# Patient Record
Sex: Female | Born: 1975 | Race: Black or African American | Hispanic: No | Marital: Single | Smoking: Current some day smoker
Health system: Southern US, Community
[De-identification: ages and names within clinical notes are randomized; demographics above are authoritative.]

## PROBLEM LIST (undated history)

## (undated) DIAGNOSIS — E119 Type 2 diabetes mellitus without complications: Secondary | ICD-10-CM

## (undated) DIAGNOSIS — E785 Hyperlipidemia, unspecified: Secondary | ICD-10-CM

## (undated) DIAGNOSIS — I10 Essential (primary) hypertension: Secondary | ICD-10-CM

## (undated) HISTORY — PX: HAND SURGERY: SHX662

## (undated) HISTORY — PX: BACK SURGERY: SHX140

## (undated) HISTORY — PX: SHOULDER SURGERY: SHX246

---

## 2009-08-26 ENCOUNTER — Encounter (INDEPENDENT_AMBULATORY_CARE_PROVIDER_SITE_OTHER): Payer: Self-pay | Admitting: Obstetrics

## 2009-08-26 ENCOUNTER — Inpatient Hospital Stay (HOSPITAL_COMMUNITY): Admission: AD | Admit: 2009-08-26 | Discharge: 2009-08-29 | Payer: Self-pay | Admitting: Obstetrics

## 2009-08-30 ENCOUNTER — Encounter: Admission: RE | Admit: 2009-08-30 | Discharge: 2009-09-29 | Payer: Self-pay | Admitting: Obstetrics

## 2009-10-28 ENCOUNTER — Encounter: Admission: RE | Admit: 2009-10-28 | Discharge: 2009-11-03 | Payer: Self-pay | Admitting: Obstetrics

## 2010-09-28 LAB — COMPREHENSIVE METABOLIC PANEL
ALT: 21 U/L (ref 0–35)
ALT: 26 U/L (ref 0–35)
AST: 31 U/L (ref 0–37)
Albumin: 2.1 g/dL — ABNORMAL LOW (ref 3.5–5.2)
Albumin: 2.2 g/dL — ABNORMAL LOW (ref 3.5–5.2)
Albumin: 2.8 g/dL — ABNORMAL LOW (ref 3.5–5.2)
Alkaline Phosphatase: 165 U/L — ABNORMAL HIGH (ref 39–117)
Alkaline Phosphatase: 184 U/L — ABNORMAL HIGH (ref 39–117)
Alkaline Phosphatase: 259 U/L — ABNORMAL HIGH (ref 39–117)
BUN: 4 mg/dL — ABNORMAL LOW (ref 6–23)
BUN: 5 mg/dL — ABNORMAL LOW (ref 6–23)
Calcium: 7.1 mg/dL — ABNORMAL LOW (ref 8.4–10.5)
Chloride: 101 mEq/L (ref 96–112)
Chloride: 106 mEq/L (ref 96–112)
GFR calc Af Amer: >60 mL/min (ref >60)
Glucose, Bld: 89 mg/dL (ref 70–99)
Potassium: 4 mEq/L (ref 3.5–5.1)
Potassium: 4.5 mEq/L (ref 3.5–5.1)
Potassium: 4.5 mEq/L (ref 3.5–5.1)
Total Bilirubin: 0.4 mg/dL (ref 0.3–1.2)
Total Bilirubin: 0.5 mg/dL (ref 0.3–1.2)
Total Protein: 5.1 g/dL — ABNORMAL LOW (ref 6.0–8.3)

## 2010-09-28 LAB — CBC
HCT: 33.3 % — ABNORMAL LOW (ref 36.0–46.0)
HCT: 34.1 % — ABNORMAL LOW (ref 36.0–46.0)
Hemoglobin: 11.2 g/dL — ABNORMAL LOW (ref 12.0–15.0)
MCHC: 33.5 g/dL (ref 30.0–36.0)
MCV: 89 fL (ref 78.0–100.0)
Platelets: 222 10*3/uL (ref 150–400)
RBC: 4.53 MIL/uL (ref 3.87–5.11)
RDW: 13.4 % (ref 11.5–15.5)
WBC: 11.9 10*3/uL — ABNORMAL HIGH (ref 4.0–10.5)
WBC: 8.3 10*3/uL (ref 4.0–10.5)

## 2010-09-28 LAB — URIC ACID
Uric Acid, Serum: 6.4 mg/dL (ref 2.4–7.0)
Uric Acid, Serum: 6.6 mg/dL (ref 2.4–7.0)

## 2010-09-28 LAB — MRSA PCR SCREENING: MRSA by PCR: NEGATIVE

## 2010-09-28 LAB — MAGNESIUM: Magnesium: 5.7 mg/dL — ABNORMAL HIGH (ref 1.5–2.5)

## 2010-09-28 LAB — RPR: RPR Ser Ql: NONREACTIVE

## 2011-01-12 ENCOUNTER — Encounter: Payer: Self-pay | Admitting: Obstetrics and Gynecology

## 2011-05-14 ENCOUNTER — Encounter: Payer: Self-pay | Admitting: *Deleted

## 2011-05-14 ENCOUNTER — Emergency Department (INDEPENDENT_AMBULATORY_CARE_PROVIDER_SITE_OTHER)
Admission: EM | Admit: 2011-05-14 | Discharge: 2011-05-14 | Disposition: A | Discharge status: Home / Self Care | Payer: Self-pay | Source: Home / Self Care | Attending: Family Medicine | Admitting: Family Medicine

## 2011-05-14 ENCOUNTER — Emergency Department (INDEPENDENT_AMBULATORY_CARE_PROVIDER_SITE_OTHER): Payer: Self-pay

## 2011-05-14 DIAGNOSIS — R109 Unspecified abdominal pain: Secondary | ICD-10-CM

## 2011-05-14 LAB — WET PREP, GENITAL
Trich, Wet Prep: NONE SEEN
Yeast Wet Prep HPF POC: NONE SEEN

## 2011-05-14 LAB — POCT PREGNANCY, URINE: Preg Test, Ur: NEGATIVE

## 2011-05-14 LAB — POCT URINALYSIS DIP (DEVICE)
Bilirubin Urine: NEGATIVE
Glucose, UA: NEGATIVE mg/dL
Ketones, ur: NEGATIVE mg/dL
Leukocytes, UA: NEGATIVE
Specific Gravity, Urine: 1.02 (ref 1.005–1.030)

## 2011-05-14 MED ORDER — METRONIDAZOLE 500 MG PO TABS: 500.0000 mg | ORAL_TABLET | Freq: Two times a day (BID) | ORAL | Status: AC

## 2011-05-14 MED ORDER — CIPROFLOXACIN HCL 500 MG PO TABS: 500.0000 mg | ORAL_TABLET | Freq: Two times a day (BID) | ORAL | Status: AC

## 2011-05-14 NOTE — ED Notes (Signed)
Pt with onset of abdominal pain 4pm yesterday left lower abd radiating across lower abd - rectal pressure

## 2011-05-14 NOTE — ED Provider Notes (Signed)
History     CSN: [id number] Arrival date & time: 05/14/2011 12:51 PM   First MD Initiated Contact with Patient 05/14/11 1236      Chief Complaint  Patient presents with  . Abdominal Pain  . Constipation  . Rectal Pain    (Consider location/radiation/quality/duration/timing/severity/associated sxs/prior treatment) HPI Comments: Pt describes the pain yesterday as constant, but with waves of sharp pain; dull constant pain today, no sharp episodes  Patient is a 35 y.o. female presenting with abdominal pain. The history is provided by the patient.  Abdominal Pain The primary symptoms of the illness include nausea. The primary symptoms of the illness do not include fever, fatigue, vomiting, diarrhea, dysuria, vaginal discharge or vaginal bleeding. Episode onset: yesterday afternoon. The onset of the illness was gradual. The problem has been gradually worsening.  The patient states that she believes she is currently not pregnant. Additional symptoms associated with the illness include back pain. Symptoms associated with the illness do not include chills, constipation, urgency, hematuria or frequency. Significant associated medical issues do not include diverticulitis. Associated medical issues comments: hx herpes otherwise no hx STDs.    History reviewed. No pertinent past medical history.  Past Surgical History  Procedure Date  . Cesarean section     History reviewed. No pertinent family history.  History  Substance Use Topics  . Smoking status: Current Some Day Smoker  . Smokeless tobacco: Not on file  . Alcohol Use: No    OB History    Grav Para Term Preterm Abortions TAB SAB Ect Mult Living                  Review of Systems  Constitutional: Negative for fever, chills and fatigue.  Respiratory: Negative.   Cardiovascular: Negative.   Gastrointestinal: Positive for nausea and abdominal distention. Negative for vomiting, diarrhea, constipation and blood in stool.   Pt denies diarrhea but states bowel movement this morning was loose  Genitourinary: Positive for flank pain. Negative for dysuria, urgency, frequency, hematuria, vaginal bleeding, vaginal discharge, vaginal pain, menstrual problem and pelvic pain.  Musculoskeletal: Positive for back pain.    Allergies  Nickel  Home Medications  No current outpatient prescriptions on file.  BP 134/78  Pulse 86  Temp(Src) 98.6 F (37 C) (Oral)  Resp 17  SpO2 98%  Physical Exam  Constitutional: Vital signs are normal. She appears well-developed and well-nourished.  Non-toxic appearance. She does not have a sickly appearance.  Cardiovascular: Normal rate and regular rhythm.   Pulmonary/Chest: Effort normal and breath sounds normal.  Abdominal: Soft. Normal appearance and bowel sounds are normal. There is tenderness in the left lower quadrant. There is no rebound, no guarding and no CVA tenderness.         LLQ pain with palp, otherwise nontender to palp; obese  Genitourinary: Uterus normal. Cervix exhibits motion tenderness. Cervix exhibits no discharge. Right adnexum displays no tenderness. Left adnexum displays no tenderness. There is bleeding around the vagina. No tenderness around the vagina. Vaginal discharge found.       Obesity limits bimanual exam; mirena strings coming from cervix    ED Course  Procedures (including critical care time)   Labs Reviewed  POCT PREGNANCY, URINE  POCT URINALYSIS DIPSTICK  POCT PREGNANCY, URINE  GC/CHLAMYDIA PROBE AMP, GENITAL  WET PREP, GENITAL   No results found.   No diagnosis found.    MDM  UA normal except trace heme; pregnancy negative Discussed with Dr. Lorenza Chick. Findings not clearly pointing  to any one diagnosis.  Possibilities include PID or diverticulitis.  Will rx cipro and flagyl for now, if any concerning findings on unresulted labs, may also need to tx for vag infection.   Pt stable at this time.        Cathlyn Parsons, NP 05/14/11  [phone]

## 2011-05-15 LAB — GC/CHLAMYDIA PROBE AMP, GENITAL: GC Probe Amp, Genital: NEGATIVE

## 2011-05-17 NOTE — ED Provider Notes (Signed)
Medical screening examination/treatment/procedure(s) were performed by non-physician practitioner and as supervising physician I was immediately available for consultation/collaboration.  Login Muckleroy G  D.O.    Nekisha Mcdiarmid G Florella Mcneese, MD 05/17/11 1456 

## 2011-05-18 NOTE — ED Notes (Signed)
Chart and labs reviewed. Pt. Adequately treated with Metronidazole.

## 2012-09-30 IMAGING — CR DG ABDOMEN ACUTE W/ 1V CHEST
3 series · 3 of 3 positions shown · non-contrast
Comparison: None.

CLINICAL DATA: Abdominal pain

ACUTE ABDOMEN SERIES (ABDOMEN 2 VIEW & CHEST 1 VIEW)

[view not recorded (1 of 3)]
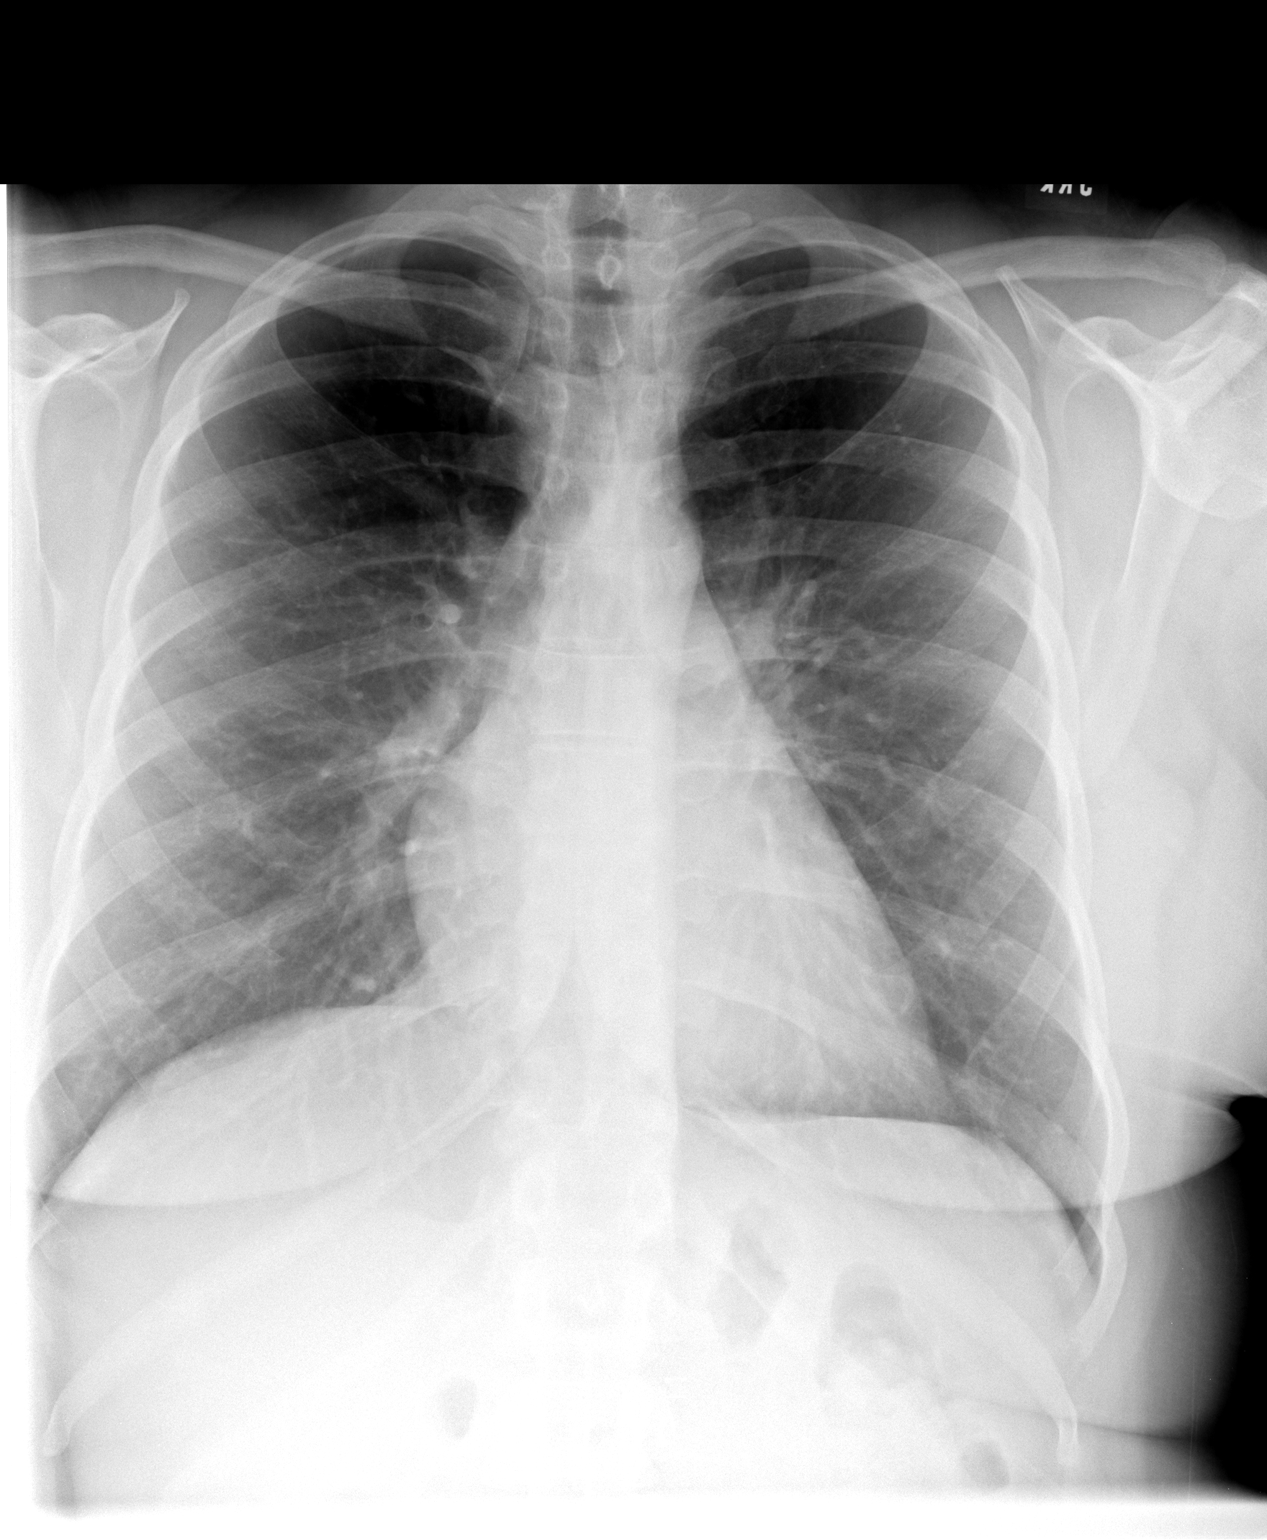

[view not recorded (2 of 3)]
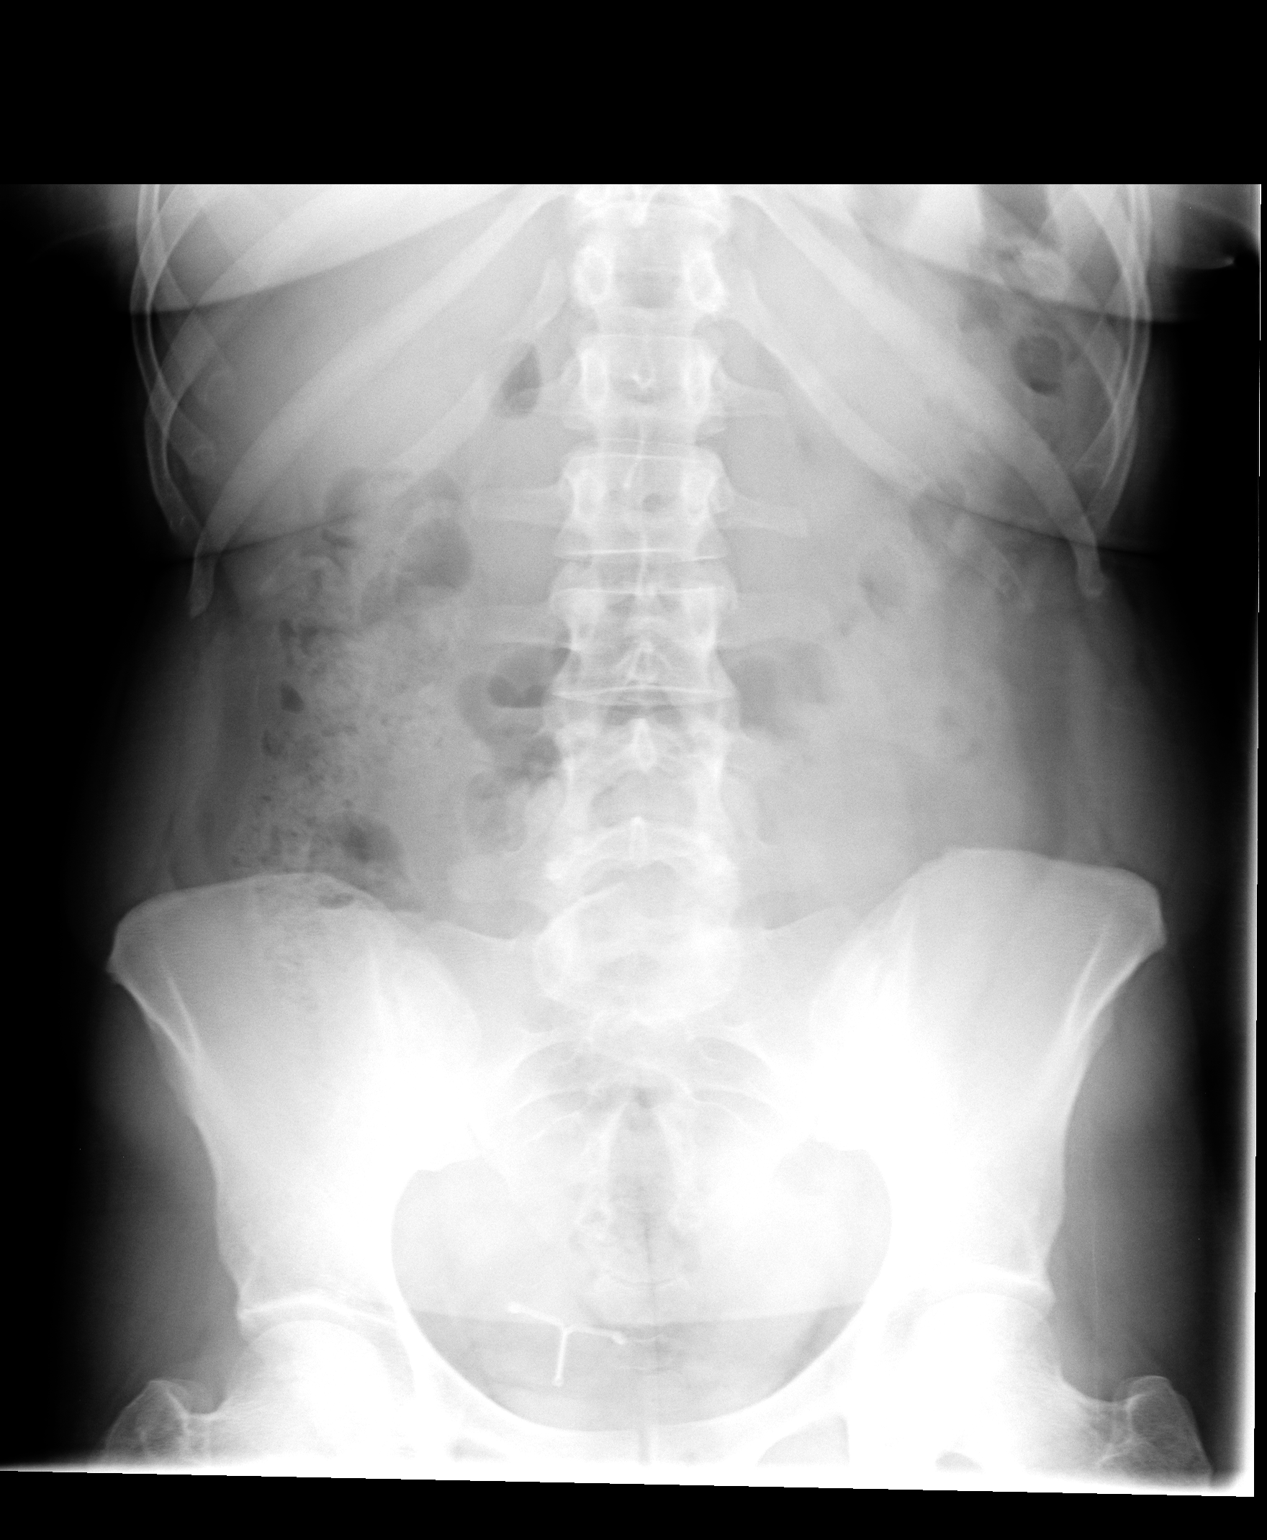

[view not recorded (3 of 3)]
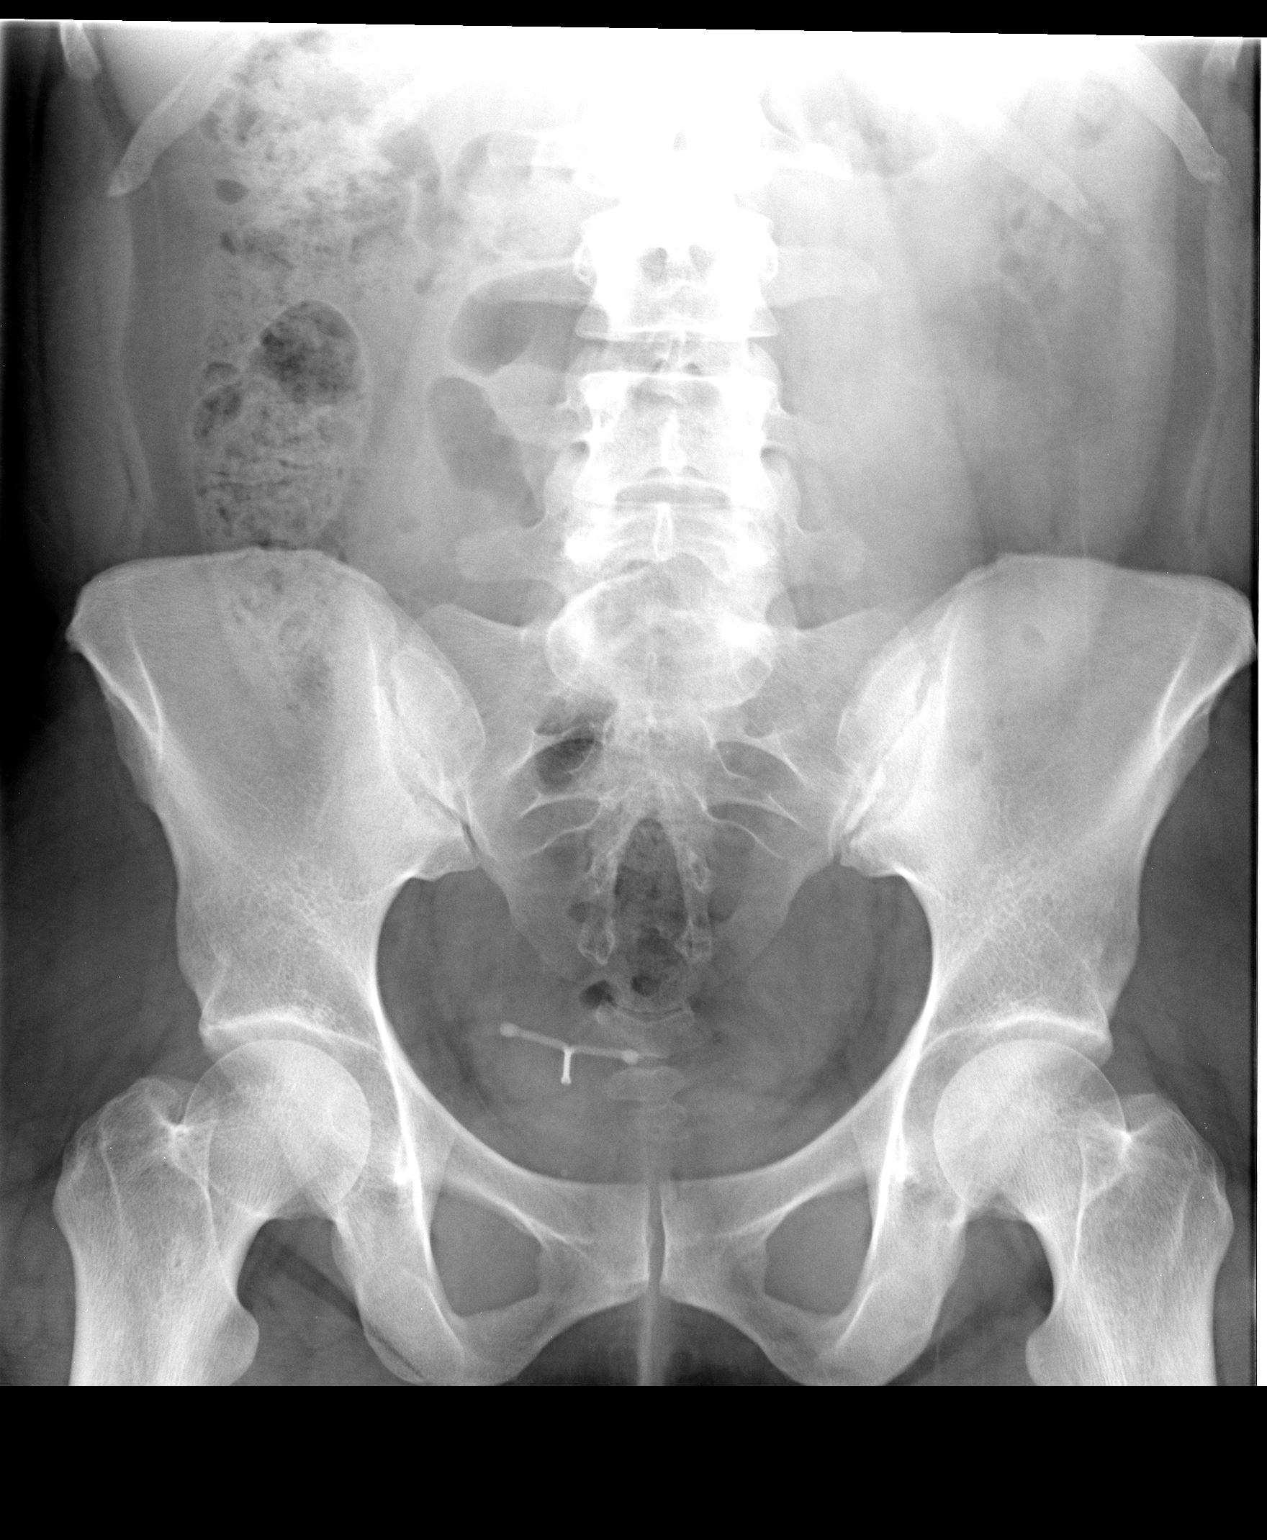

[3 of 3 positions shown; findings below may reference images not displayed]

FINDINGS: Normal heart size and vascularity.  Lungs remain clear.
Trachea is midline.  No effusions.  No free air or pneumothorax.

Scattered air and stool throughout the bowel.  Negative for
obstruction or ileus.  IUD noted in the pelvis just right of
midline.
IMPRESSION: No acute finding.

## 2017-09-15 ENCOUNTER — Encounter (HOSPITAL_COMMUNITY): Payer: Self-pay | Admitting: Family Medicine

## 2017-09-15 ENCOUNTER — Ambulatory Visit (HOSPITAL_COMMUNITY)
Admission: EM | Admit: 2017-09-15 | Discharge: 2017-09-15 | Disposition: A | Discharge status: Home / Self Care | Payer: PRIVATE HEALTH INSURANCE | Attending: Family Medicine | Admitting: Family Medicine

## 2017-09-15 ENCOUNTER — Ambulatory Visit (INDEPENDENT_AMBULATORY_CARE_PROVIDER_SITE_OTHER): Payer: PRIVATE HEALTH INSURANCE

## 2017-09-15 DIAGNOSIS — R1032 Left lower quadrant pain: Secondary | ICD-10-CM

## 2017-09-15 DIAGNOSIS — K5900 Constipation, unspecified: Secondary | ICD-10-CM

## 2017-09-15 DIAGNOSIS — Z3202 Encounter for pregnancy test, result negative: Secondary | ICD-10-CM

## 2017-09-15 LAB — POCT URINALYSIS DIP (DEVICE)
BILIRUBIN URINE: NEGATIVE
GLUCOSE, UA: NEGATIVE mg/dL
LEUKOCYTES UA: NEGATIVE
NITRITE: NEGATIVE
PH: 5.5 (ref 5.0–8.0)
Protein, ur: NEGATIVE mg/dL
Specific Gravity, Urine: 1.025 (ref 1.005–1.030)
Urobilinogen, UA: 0.2 mg/dL (ref 0.0–1.0)

## 2017-09-15 LAB — POCT PREGNANCY, URINE: PREG TEST UR: NEGATIVE

## 2017-09-15 NOTE — Discharge Instructions (Signed)
Try MiraLAX 1-2 times daily over the next 3-4 days. If no improvement, try using an enema. Stay well hydrated and keep lots of fiber in your diet.  

## 2017-09-15 NOTE — ED Provider Notes (Signed)
  [facility]    CSN: [id number] Arrival date & time: 09/15/17  1942  Chief Complaint  Patient presents with  . Abdominal Pain  . Fever    Jill Hughes is here for abdominal pain.  Duration: 2 days started having LLQ abd pain. Nothing she notices makes it worse other than touching it. Laying down makes it better. She has not been eating or drinking normally over past couple days. She started having a headache today. No bleeding, N/V/D, injury.    ROS: GU: No urinary complaints GI: No N/V/D/C, no bleeding + pain  History reviewed. No pertinent past medical history. History reviewed. No pertinent family history. Past Surgical History:  Procedure Laterality Date  . CESAREAN SECTION      BP (!) 145/91   Pulse (!) 108   Temp 99 F (37.2 C)   Resp 18   SpO2 100%  Gen.: Awake, alert, appears stated age HEENT: Mucous membranes moist without mucosal lesions Heart: Regular rate and rhythm without murmurs Lungs: Clear auscultation bilaterally, no rales or wheezing, normal effort without accessory muscle use. Abdomen: Bowel sounds are present. Abdomen is soft, TTP in LLQ, nondistended, no masses or organomegaly. Negative Murphy's, Rovsing's, McBurney's, and Carnett's sign. MSK: No CVA ttp Psych: Age appropriate judgment and insight. Normal mood and affect.  Constipation, unspecified constipation type  XR overall neg, does show some stool and gas. Will tx for constipation. She has appt w GYN Tues. May need eval of ovarian etiology.  F/u prn otherwise.  Pt voiced understanding and agreement to the plan.     [name], [name] [place], [place] 09/15/17 2127

## 2017-09-15 NOTE — ED Triage Notes (Signed)
Pt here for LLQ and flank pain since Thursday. sts started after she took some apple cider vinegar Thursday. sts also HA, fatigue and leg pain.

## 2019-02-02 IMAGING — DX DG ABDOMEN 1V
2 series · 2 of 2 positions shown · non-contrast
Comparison: None.

CLINICAL DATA: Patient states that she started having left lower
quadrant pain yesterday. No other symptoms. No Hx

EXAM:
ABDOMEN - 1 VIEW

[abdomen kub (1 of 2)]
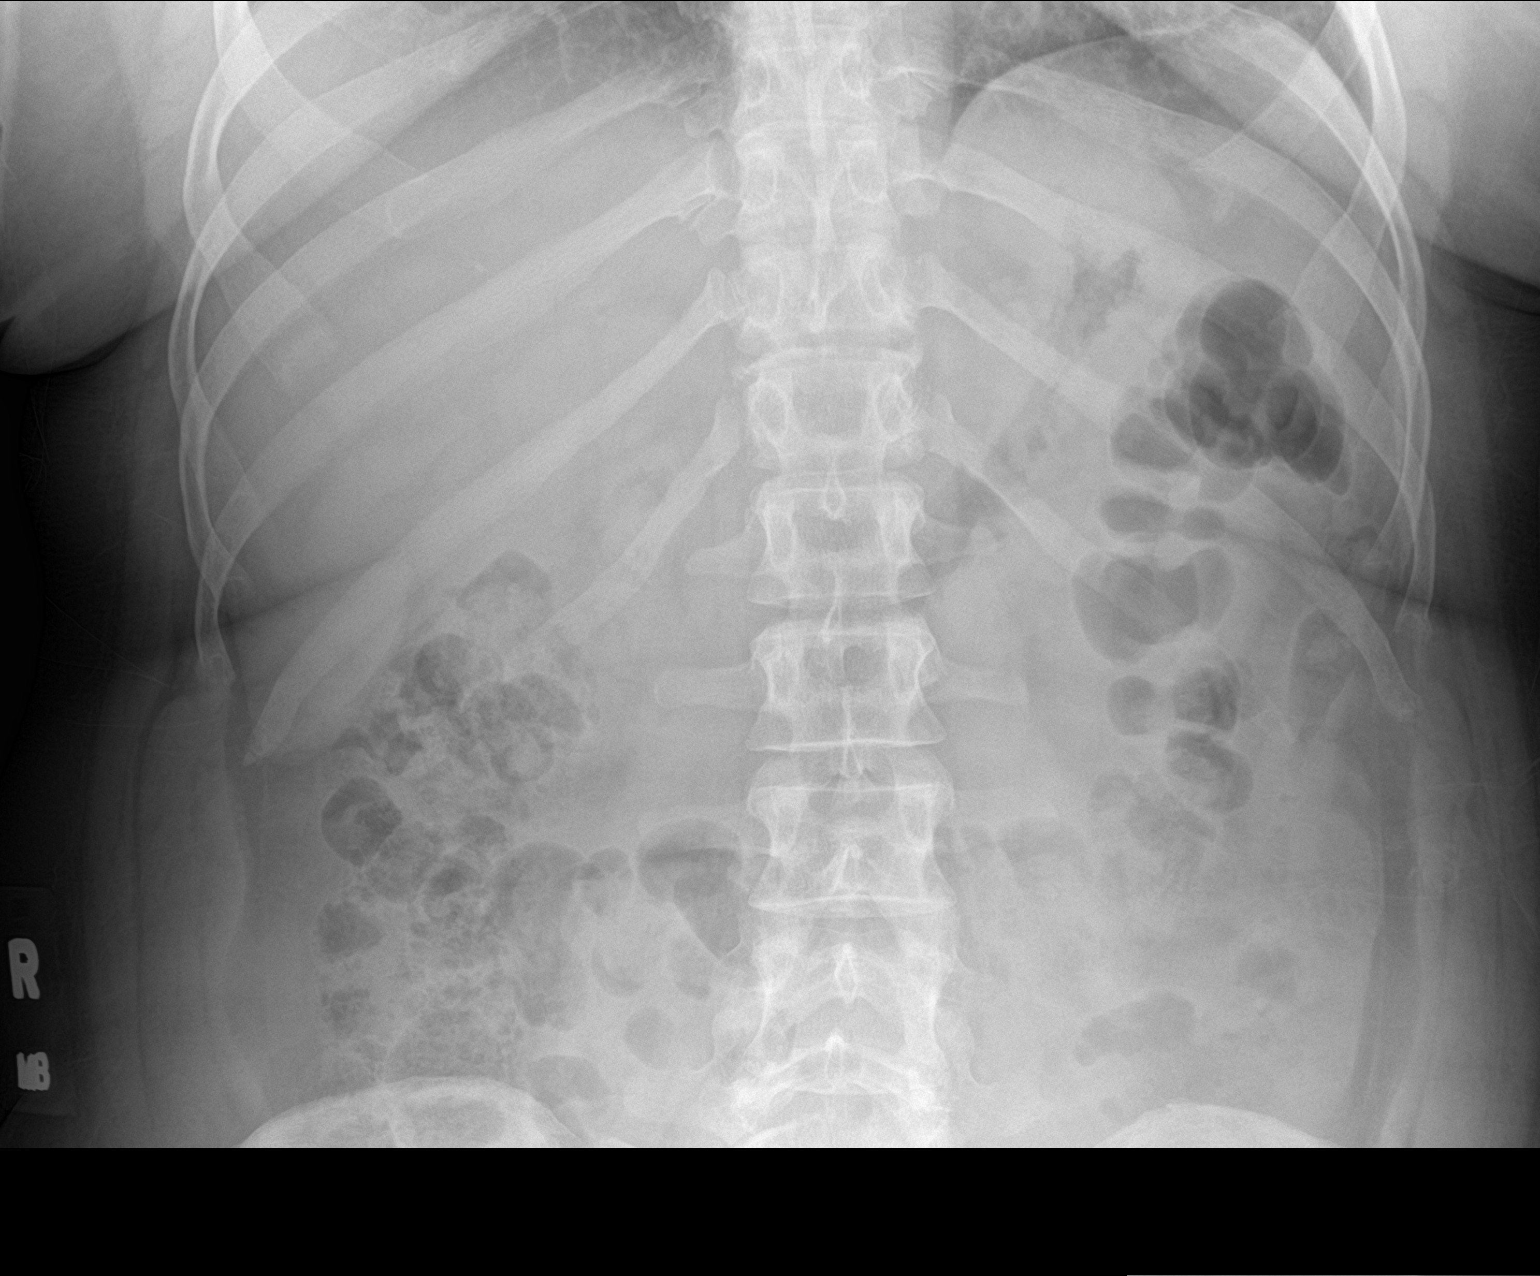

[abdomen kub (2 of 2)]
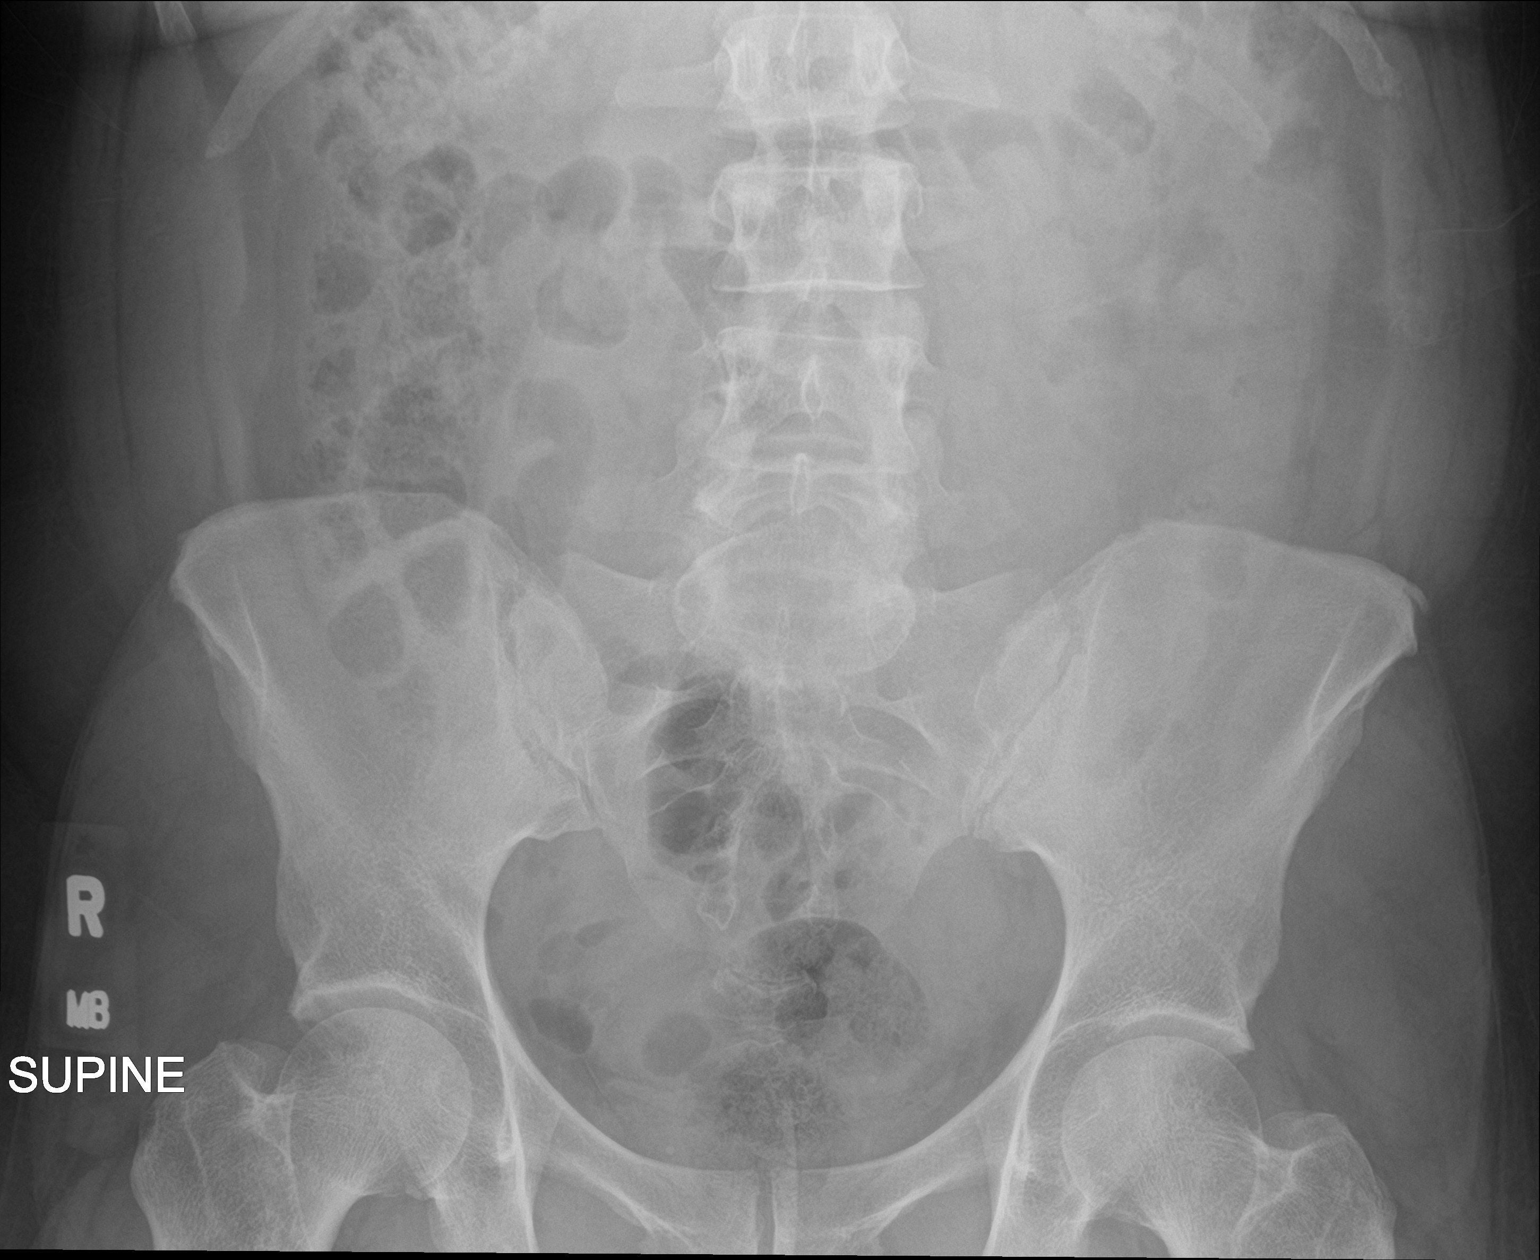

[2 of 2 positions shown; findings below may reference images not displayed]

FINDINGS: The bowel gas pattern is normal. No radio-opaque calculi or other
significant radiographic abnormality are seen.
IMPRESSION: Negative.

## 2021-11-07 ENCOUNTER — Other Ambulatory Visit: Payer: Self-pay | Admitting: Family Medicine

## 2021-11-07 DIAGNOSIS — M542 Cervicalgia: Secondary | ICD-10-CM

## 2021-11-16 ENCOUNTER — Ambulatory Visit
Admission: RE | Admit: 2021-11-16 | Discharge: 2021-11-16 | Disposition: A | Discharge status: Home / Self Care | Payer: BC Managed Care – PPO | Source: Ambulatory Visit | Attending: Family Medicine | Admitting: Family Medicine

## 2021-11-16 DIAGNOSIS — M542 Cervicalgia: Secondary | ICD-10-CM

## 2021-11-16 MED ORDER — METHYLPREDNISOLONE ACETATE 40 MG/ML INJ SUSP (RADIOLOG: 80.0000 mg | Freq: Once | INTRAMUSCULAR | Status: DC

## 2021-11-16 MED ORDER — IOPAMIDOL (ISOVUE-M 200) INJECTION 41%
1.0000 mL | Freq: Once | INTRAMUSCULAR | Status: AC
  Administered 2021-11-16: 1 mL via EPIDURAL

## 2021-11-16 NOTE — Discharge Instructions (Signed)

## 2021-12-15 ENCOUNTER — Ambulatory Visit
Admission: RE | Admit: 2021-12-15 | Discharge: 2021-12-15 | Disposition: A | Discharge status: Home / Self Care | Payer: BC Managed Care – PPO | Source: Ambulatory Visit | Attending: Internal Medicine | Admitting: Internal Medicine

## 2021-12-15 VITALS — BP 140/85 | HR 95 | Temp 99.0°F | Resp 18

## 2021-12-15 DIAGNOSIS — J069 Acute upper respiratory infection, unspecified: Secondary | ICD-10-CM | POA: Diagnosis not present

## 2021-12-15 DIAGNOSIS — J029 Acute pharyngitis, unspecified: Secondary | ICD-10-CM | POA: Diagnosis present

## 2021-12-15 DIAGNOSIS — R053 Chronic cough: Secondary | ICD-10-CM | POA: Diagnosis present

## 2021-12-15 LAB — POCT RAPID STREP A (OFFICE): Rapid Strep A Screen: NEGATIVE

## 2021-12-15 MED ORDER — AMOXICILLIN-POT CLAVULANATE 875-125 MG PO TABS: 1.0000 | ORAL_TABLET | Freq: Two times a day (BID) | ORAL | 0 refills | Status: AC

## 2021-12-15 NOTE — ED Provider Notes (Signed)
[facility]    CSN: [id number] Arrival date & time: 12/15/21  1150      History   Chief Complaint Chief Complaint  Patient presents with   Cough    Got sick coughing 3wks ago, cough almost gone but SEVERE pain in ear, neck, throat mostly on one side but does travel. UNBEARABLE to talk & swallow!!! - Entered by patient   Sore Throat    HPI Jill Hughes is a 46 y.o. female.   Patient presents with sore throat and bilateral ear pain that has been present for multiple weeks.  Patient reports that she has been having persistent cough as well.  Patient was seen when symptoms first started by another healthcare provider at another urgent care on 11/23/2021.  She was diagnosed with a viral upper respiratory infection after being exposed to her daughter with similar symptoms.  She was prescribed Tessalon Perles with no improvement in symptoms.  She reports that she has taken several over-the-counter cold and flu medications with minimal improvement in symptoms.  Denies any associated chest pain, shortness of breath, nausea, vomiting, diarrhea, abdominal pain.  Denies history of asthma or COPD.  Patient is very upset that she was only prescribed Tessalon Perles at previous urgent care visit at a different facility.  She reports that she thinks that she needs antibiotics as her daughter was treated with azithromycin for symptoms. Had negative covid test at previous urgent care visit.    Cough Sore Throat    History reviewed. No pertinent past medical history.  There are no problems to display for this patient.   Past Surgical History:  Procedure Laterality Date   CESAREAN SECTION      OB History   No obstetric history on file.      Home Medications    Prior to Admission medications   Medication Sig Start Date End Date Taking? Authorizing Provider  amoxicillin-clavulanate (AUGMENTIN) 875-125 MG tablet Take 1 tablet by mouth every 12 (twelve) hours. 12/15/21  Yes  Gustavus Bryant, FNP    Family History History reviewed. No pertinent family history.  Social History Social History   Tobacco Use   Smoking status: Some Days  Substance Use Topics   Alcohol use: No   Drug use: No     Allergies   Nickel   Review of Systems Review of Systems Per HPI  Physical Exam Triage Vital Signs ED Triage Vitals  Enc Vitals Group     BP 12/15/21 1231 140/85     Pulse Rate 12/15/21 1231 95     Resp 12/15/21 1231 18     Temp 12/15/21 1231 99 F (37.2 C)     Temp Source 12/15/21 1231 Oral     SpO2 12/15/21 1231 96 %     Weight --      Height --      Head Circumference --      Peak Flow --      Pain Score 12/15/21 1227 4     Pain Loc --      Pain Edu? --      Excl. in GC? --    No data found.  Updated Vital Signs BP 140/85 (BP Location: Left Arm)   Pulse 95   Temp 99 F (37.2 C) (Oral)   Resp 18   LMP 11/09/2021   SpO2 96%   Visual Acuity Right Eye Distance:   Left Eye Distance:   Bilateral Distance:    Right Eye  Near:   Left Eye Near:    Bilateral Near:     Physical Exam Constitutional:      General: She is not in acute distress.    Appearance: Normal appearance. She is not toxic-appearing or diaphoretic.  HENT:     Head: Normocephalic and atraumatic.     Right Ear: Tympanic membrane and ear canal normal.     Left Ear: Tympanic membrane and ear canal normal.     Nose: Congestion present.     Mouth/Throat:     Mouth: Mucous membranes are moist.     Pharynx: Posterior oropharyngeal erythema present.  Eyes:     Extraocular Movements: Extraocular movements intact.     Conjunctiva/sclera: Conjunctivae normal.     Pupils: Pupils are equal, round, and reactive to light.  Cardiovascular:     Rate and Rhythm: Normal rate and regular rhythm.     Pulses: Normal pulses.     Heart sounds: Normal heart sounds.  Pulmonary:     Effort: Pulmonary effort is normal. No respiratory distress.     Breath sounds: Normal breath sounds. No  stridor. No wheezing, rhonchi or rales.  Abdominal:     General: Abdomen is flat. Bowel sounds are normal.     Palpations: Abdomen is soft.  Musculoskeletal:        General: Normal range of motion.     Cervical back: Normal range of motion.  Skin:    General: Skin is warm and dry.  Neurological:     General: No focal deficit present.     Mental Status: She is alert and oriented to person, place, and time. Mental status is at baseline.  Psychiatric:        Mood and Affect: Mood normal.        Behavior: Behavior normal.      UC Treatments / Results  Labs (all labs ordered are listed, but only abnormal results are displayed) Labs Reviewed  CULTURE, GROUP A STREP [facility])  POCT RAPID STREP A (OFFICE)    EKG   Radiology No results found.  Procedures Procedures (including critical care time)  Medications Ordered in UC Medications - No data to display  Initial Impression / Assessment and Plan / UC Course  I have reviewed the triage vital signs and the nursing notes.  Pertinent labs & imaging results that were available during my care of the patient were reviewed by me and considered in my medical decision making (see chart for details).     Suggested chest x-ray to patient given persistent cough but patient declined.  Risks associated with not doing chest x-ray were discussed with patient.  Patient voiced understanding.  Lung sounds are clear so no suspicion for pneumonia at this time.  Suspect the patient could have bronchitis but due to duration of symptoms I do think that antibiotic therapy is warranted.  Will treat with Augmentin as patient takes atorvastatin and azithromycin may be contraindicated.  Rapid strep was negative.  Throat culture is pending.  Do not think that additional viral testing is necessary given duration of symptoms.  No wheezing on exam and no shortness of breath so will defer prednisone given that patient takes medication for diabetes and does not have  glucose monitor at home.  Patient was given strict return and ER precautions.  Patient verbalized understanding and was agreeable with plan. Final Clinical Impressions(s) / UC Diagnoses   Final diagnoses:  Acute upper respiratory infection  Persistent cough  Sore throat  Discharge Instructions      You are being treated with an antibiotic for symptoms.  Please follow-up if symptoms persist or worsen.    ED Prescriptions     Medication Sig Dispense Auth. Provider   amoxicillin-clavulanate (AUGMENTIN) 875-125 MG tablet Take 1 tablet by mouth every 12 (twelve) hours. 14 tablet [place], Acie Fredrickson, [place]      PDMP not reviewed this encounter.   Gustavus Bryant, [place] 12/15/21 1318

## 2021-12-15 NOTE — Discharge Instructions (Signed)
You are being treated with an antibiotic for symptoms.  Please follow-up if symptoms persist or worsen.

## 2021-12-15 NOTE — ED Triage Notes (Signed)
Pt states severe sore throat has been ongoing for the past weeks. Pt states she has been having severe pain bilateral ears. Pt has not taken any medication today.

## 2021-12-16 LAB — CULTURE, GROUP A STREP (THRC)

## 2021-12-18 LAB — CULTURE, GROUP A STREP (THRC)

## 2022-05-13 ENCOUNTER — Emergency Department (HOSPITAL_BASED_OUTPATIENT_CLINIC_OR_DEPARTMENT_OTHER): Payer: BC Managed Care – PPO

## 2022-05-13 ENCOUNTER — Encounter (HOSPITAL_BASED_OUTPATIENT_CLINIC_OR_DEPARTMENT_OTHER): Payer: Self-pay | Admitting: Emergency Medicine

## 2022-05-13 ENCOUNTER — Emergency Department (HOSPITAL_BASED_OUTPATIENT_CLINIC_OR_DEPARTMENT_OTHER)
Admission: EM | Admit: 2022-05-13 | Discharge: 2022-05-13 | Disposition: A | Discharge status: Home / Self Care | Payer: BC Managed Care – PPO | Attending: Emergency Medicine | Admitting: Emergency Medicine

## 2022-05-13 DIAGNOSIS — R7401 Elevation of levels of liver transaminase levels: Secondary | ICD-10-CM | POA: Diagnosis not present

## 2022-05-13 DIAGNOSIS — R519 Headache, unspecified: Secondary | ICD-10-CM | POA: Insufficient documentation

## 2022-05-13 DIAGNOSIS — E876 Hypokalemia: Secondary | ICD-10-CM | POA: Diagnosis not present

## 2022-05-13 DIAGNOSIS — R945 Abnormal results of liver function studies: Secondary | ICD-10-CM | POA: Diagnosis not present

## 2022-05-13 DIAGNOSIS — R000 Tachycardia, unspecified: Secondary | ICD-10-CM | POA: Diagnosis not present

## 2022-05-13 DIAGNOSIS — E119 Type 2 diabetes mellitus without complications: Secondary | ICD-10-CM | POA: Insufficient documentation

## 2022-05-13 DIAGNOSIS — R7989 Other specified abnormal findings of blood chemistry: Secondary | ICD-10-CM

## 2022-05-13 DIAGNOSIS — R202 Paresthesia of skin: Secondary | ICD-10-CM

## 2022-05-13 HISTORY — DX: Type 2 diabetes mellitus without complications: E11.9

## 2022-05-13 HISTORY — DX: Essential (primary) hypertension: I10

## 2022-05-13 HISTORY — DX: Hyperlipidemia, unspecified: E78.5

## 2022-05-13 LAB — CBC WITH DIFFERENTIAL/PLATELET
Abs Immature Granulocytes: 0.04 10*3/uL (ref 0.00–0.07)
Basophils Absolute: 0 10*3/uL (ref 0.0–0.1)
Basophils Relative: 0 %
Eosinophils Absolute: 0 10*3/uL (ref 0.0–0.5)
Eosinophils Relative: 0 %
HCT: 44.3 % (ref 36.0–46.0)
Hemoglobin: 15 g/dL (ref 12.0–15.0)
Immature Granulocytes: 0 %
Lymphocytes Relative: 23 %
Lymphs Abs: 2.4 10*3/uL (ref 0.7–4.0)
MCH: 28.2 pg (ref 26.0–34.0)
MCHC: 33.9 g/dL (ref 30.0–36.0)
MCV: 83.3 fL (ref 80.0–100.0)
Monocytes Absolute: 0.8 10*3/uL (ref 0.1–1.0)
Monocytes Relative: 8 %
Neutro Abs: 7.2 10*3/uL (ref 1.7–7.7)
Neutrophils Relative %: 69 %
Platelets: 449 10*3/uL — ABNORMAL HIGH (ref 150–400)
RBC: 5.32 MIL/uL — ABNORMAL HIGH (ref 3.87–5.11)
RDW: 13.7 % (ref 11.5–15.5)
WBC: 10.4 10*3/uL (ref 4.0–10.5)
nRBC: 0 % (ref 0.0–0.2)

## 2022-05-13 LAB — TROPONIN I (HIGH SENSITIVITY): Troponin I (High Sensitivity): 3 ng/L (ref <18)

## 2022-05-13 LAB — COMPREHENSIVE METABOLIC PANEL
ALT: 47 U/L — ABNORMAL HIGH (ref 0–44)
AST: 43 U/L — ABNORMAL HIGH (ref 15–41)
Albumin: 4.4 g/dL (ref 3.5–5.0)
Alkaline Phosphatase: 90 U/L (ref 38–126)
Anion gap: 12 (ref 5–15)
BUN: 10 mg/dL (ref 6–20)
CO2: 25 mmol/L (ref 22–32)
Calcium: 9.1 mg/dL (ref 8.9–10.3)
Chloride: 102 mmol/L (ref 98–111)
Creatinine, Ser: 0.8 mg/dL (ref 0.44–1.00)
GFR, Estimated: >60 mL/min (ref >60)
Glucose, Bld: 106 mg/dL — ABNORMAL HIGH (ref 70–99)
Potassium: 3.4 mmol/L — ABNORMAL LOW (ref 3.5–5.1)
Sodium: 139 mmol/L (ref 135–145)
Total Bilirubin: 0.5 mg/dL (ref 0.3–1.2)
Total Protein: 8.5 g/dL — ABNORMAL HIGH (ref 6.5–8.1)

## 2022-05-13 LAB — MAGNESIUM: Magnesium: 2.1 mg/dL (ref 1.7–2.4)

## 2022-05-13 LAB — PREGNANCY, URINE: Preg Test, Ur: NEGATIVE

## 2022-05-13 MED ORDER — DIPHENHYDRAMINE HCL 50 MG/ML IJ SOLN
25.0000 mg | Freq: Once | INTRAMUSCULAR | Status: AC
  Administered 2022-05-13: 25 mg via INTRAVENOUS
  Filled 2022-05-13: qty 1

## 2022-05-13 MED ORDER — POTASSIUM CHLORIDE CRYS ER 20 MEQ PO TBCR
40.0000 meq | EXTENDED_RELEASE_TABLET | Freq: Once | ORAL | Status: AC
  Administered 2022-05-13: 40 meq via ORAL
  Filled 2022-05-13: qty 2

## 2022-05-13 MED ORDER — LACTATED RINGERS IV BOLUS
1000.0000 mL | Freq: Once | INTRAVENOUS | Status: AC
  Administered 2022-05-13: 1000 mL via INTRAVENOUS

## 2022-05-13 MED ORDER — METOCLOPRAMIDE HCL 5 MG/ML IJ SOLN
10.0000 mg | Freq: Once | INTRAMUSCULAR | Status: AC
  Administered 2022-05-13: 10 mg via INTRAVENOUS
  Filled 2022-05-13: qty 2

## 2022-05-13 NOTE — ED Notes (Signed)
NA x 1

## 2022-05-13 NOTE — Discharge Instructions (Addendum)
Take ibuprofen for your headache should it recur.  Your liver function tests were slightly abnormal and you should follow-up with your family doctor to get these rechecked in a week or 2.  Avoid alcohol and Tylenol for now.  If you develop continued, recurrent, or worsening headache, fever, neck stiffness, vomiting, blurry or double vision, weakness or numbness in your arms or legs, trouble speaking, or any other new/concerning symptoms then return to the ER for evaluation.

## 2022-05-13 NOTE — ED Provider Notes (Signed)
[facility] Provider Note   CSN: [id number] Arrival date & time: 05/13/22  1935     History  Chief Complaint  Patient presents with   Multiple Complaints    Jill Hughes is a 46 y.o. female.  HPI 46 year old female presents with a chief complaint of headache and tingling. On 11/1 she developed a left-sided headache in the morning and it lasted most of the day.  It was about a 6 out of 10.  It was not thunderclap in onset.  She felt like she was going to throw up but did not.  No fevers.  Headache seems to be gone although now she has a pressure behind her eye and it feels like a headache is trying to come back.  Since the next day she has had on and off tingling.  This is mostly in her left upper extremity from the elbow down and left lower extremity from the knee down.  No actual weakness.  The tingling seems to come and go.  She also feels like there is a discomfort in her arm and leg.  No visual complaints.  No dyspnea.  She has a history of type 2 diabetes but doesn't know what her glucose is. Takes her meds but doesn't check her glucose.  Home Medications Prior to Admission medications   Medication Sig Start Date End Date Taking? Authorizing Provider  amoxicillin-clavulanate (AUGMENTIN) 875-125 MG tablet Take 1 tablet by mouth every 12 (twelve) hours. 12/15/21   Teodora Medici, FNP      Allergies    Nickel    Review of Systems   Review of Systems  Constitutional:  Negative for fever.  Eyes:  Negative for visual disturbance.  Respiratory:  Negative for cough.   Gastrointestinal:  Positive for nausea. Negative for vomiting.  Neurological:  Positive for numbness and headaches. Negative for weakness.    Physical Exam Updated Vital Signs BP (!) 134/56   Pulse 100   Temp 98.6 F (37 C)   Resp 17   Ht 5' 5.5" (1.664 m)   Wt 92.5 kg   LMP 02/27/2022 (Approximate) Comment: on BCP  SpO2 98%   BMI 33.43 kg/m  Physical Exam Vitals and  nursing note reviewed.  Constitutional:      General: She is not in acute distress.    Appearance: She is well-developed. She is not ill-appearing or diaphoretic.  HENT:     Head: Normocephalic and atraumatic.  Eyes:     Extraocular Movements: Extraocular movements intact.     Pupils: Pupils are equal, round, and reactive to light.  Cardiovascular:     Rate and Rhythm: Regular rhythm. Tachycardia present.     Pulses:          Radial pulses are 2+ on the left side.       Dorsalis pedis pulses are 2+ on the left side.     Heart sounds: Normal heart sounds.  Pulmonary:     Effort: Pulmonary effort is normal.     Breath sounds: Normal breath sounds.  Abdominal:     General: There is no distension.     Palpations: Abdomen is soft.     Tenderness: There is no abdominal tenderness.  Musculoskeletal:     Cervical back: No rigidity.  Skin:    General: Skin is warm and dry.  Neurological:     Mental Status: She is alert.     Comments: CN 3-12 grossly intact. 5/5  strength in all 4 extremities. Grossly normal sensation, save for some subjective decrease sensation to the left medial lower arm, left medial lower leg and left foot. Normal finger to nose.      ED Results / Procedures / Treatments   Labs (all labs ordered are listed, but only abnormal results are displayed) Labs Reviewed  CBC WITH DIFFERENTIAL/PLATELET - Abnormal; Notable for the following components:      Result Value   RBC 5.32 (*)    Platelets 449 (*)    All other components within normal limits  COMPREHENSIVE METABOLIC PANEL - Abnormal; Notable for the following components:   Potassium 3.4 (*)    Glucose, Bld 106 (*)    Total Protein 8.5 (*)    AST 43 (*)    ALT 47 (*)    All other components within normal limits  MAGNESIUM  PREGNANCY, URINE  TROPONIN I (HIGH SENSITIVITY)    EKG EKG Interpretation  Date/Time:  Saturday May 13 2022 20:13:06 EDT Ventricular Rate:  113 PR Interval:  144 QRS  Duration: 74 QT Interval:  338 QTC Calculation: 463 R Axis:   66 Text Interpretation: Sinus tachycardia T wave abnormality, consider lateral ischemia Abnormal ECG No previous ECGs available Confirmed by Pricilla Loveless [phone]) on 05/13/2022 8:34:11 PM  Radiology CT Head Wo Contrast  Result Date: 05/13/2022 CLINICAL DATA:  Multiple areas of numbness and tingling. EXAM: CT HEAD WITHOUT CONTRAST TECHNIQUE: Contiguous axial images were obtained from the base of the skull through the vertex without intravenous contrast. RADIATION DOSE REDUCTION: This exam was performed according to the departmental dose-optimization program which includes automated exposure control, adjustment of the mA and/or kV according to patient size and/or use of iterative reconstruction technique. COMPARISON:  October 05, 2014 FINDINGS: Brain: No evidence of acute infarction, hemorrhage, hydrocephalus, extra-axial collection or mass lesion/mass effect. Vascular: No hyperdense vessel or unexpected calcification. Skull: Normal. Negative for fracture or focal lesion. Sinuses/Orbits: No acute finding. Other: None. IMPRESSION: No acute intracranial pathology. Electronically Signed   By: Aram Candela M.D.   On: 05/13/2022 22:10   DG Chest 2 View  Result Date: 05/13/2022 CLINICAL DATA:  Intermittent chest discomfort. EXAM: CHEST - 2 VIEW COMPARISON:  May 14, 2011 FINDINGS: The heart size and mediastinal contours are within normal limits. Both lungs are clear. The visualized skeletal structures are unremarkable. IMPRESSION: No active cardiopulmonary disease. Electronically Signed   By: Aram Candela M.D.   On: 05/13/2022 21:08    Procedures Procedures    Medications Ordered in ED Medications  potassium chloride SA (KLOR-CON M) CR tablet 40 mEq (has no administration in time range)  lactated ringers bolus 1,000 mL (1,000 mLs Intravenous New Bag/Given 05/13/22 2222)  metoCLOPramide (REGLAN) injection 10 mg (10 mg Intravenous  Given 05/13/22 2214)  diphenhydrAMINE (BENADRYL) injection 25 mg (25 mg Intravenous Given 05/13/22 2214)    ED Course/ Medical Decision Making/ A&P                           Medical Decision Making Amount and/or Complexity of Data Reviewed Labs: ordered.    Details: Normal WBC, hemoglobin.  Mild hypokalemia and mildly elevated AST/ALT.  Magnesium normal. Radiology: ordered and independent interpretation performed.    Details: CT head without head bleed.  Chest x-ray without pneumonia ECG/medicine tests: independent interpretation performed.    Details: Tachycardia, nonspecific T waves but no old to compare to.  Risk Prescription drug management.   Patient  presents with paresthesias.  The headache that she had several days ago seems to be a lot better though a little bit of residual discomfort.  She was given Reglan and Benadryl and fluids and does feel better from a headache perspective and feels like the tingling is a little better though not gone.  CT head is unremarkable after multiple days of symptoms.  I think stroke is pretty unlikely.  This might be a complicated migraine versus just paresthesias.  She has multiple nonspecific symptoms.  However no weakness.  I discussed we could send her over to [facility] for MRI as is not available at this facility or have her follow-up as an outpatient with neurology.  She would like to follow-up which I think is reasonable.  Of note, she developed some very brief chest pain basically when triaging into the ER that is gone.  Troponins have been sent by triage nurse and the first one is negative.  We discussed that based on the time of onset we will need to do a second one to fully rule out MI though both patient and I agree this is pretty unlikely based on her atypical chest pain.  She declines a second which I think is reasonable.  At this point, vital signs are improved with normal heart rate and she feels better and looks well enough for  discharge.  Will replace potassium.  Will discharge home with return precautions and neuro follow-up.        Final Clinical Impression(s) / ED Diagnoses Final diagnoses:  Paresthesia  Left-sided headache  Hypokalemia  Abnormal LFTs    Rx / DC Orders ED Discharge Orders          Ordered    Ambulatory referral to Neurology       Comments: An appointment is requested in approximately: 2 weeks   05/13/22 2306              Sherwood Gambler, MD 05/13/22 2313

## 2022-05-13 NOTE — ED Triage Notes (Addendum)
Pt reports multiple complaints: abd pain on Thurs a week ago; various parts of body with numbness and tingling since Wed; pain behind LT eye Wed that has resolved; does not feel like herself; chest feels funny now; head feels strange now; sts she is here for "preventative" measures

## 2022-05-13 NOTE — ED Notes (Signed)
Called pt for room, no answer

## 2024-05-08 ENCOUNTER — Other Ambulatory Visit: Payer: Self-pay | Admitting: Medical Genetics

## 2024-06-08 ENCOUNTER — Emergency Department (HOSPITAL_BASED_OUTPATIENT_CLINIC_OR_DEPARTMENT_OTHER)
Admission: EM | Admit: 2024-06-08 | Discharge: 2024-06-08 | Disposition: A | Discharge status: Home / Self Care | Attending: Emergency Medicine | Admitting: Emergency Medicine

## 2024-06-08 ENCOUNTER — Other Ambulatory Visit: Payer: Self-pay

## 2024-06-08 ENCOUNTER — Encounter (HOSPITAL_BASED_OUTPATIENT_CLINIC_OR_DEPARTMENT_OTHER): Payer: Self-pay

## 2024-06-08 ENCOUNTER — Emergency Department (HOSPITAL_BASED_OUTPATIENT_CLINIC_OR_DEPARTMENT_OTHER)

## 2024-06-08 DIAGNOSIS — R0789 Other chest pain: Secondary | ICD-10-CM | POA: Diagnosis present

## 2024-06-08 DIAGNOSIS — R079 Chest pain, unspecified: Secondary | ICD-10-CM

## 2024-06-08 DIAGNOSIS — E876 Hypokalemia: Secondary | ICD-10-CM | POA: Insufficient documentation

## 2024-06-08 LAB — CBC
HCT: 42 % (ref 36.0–46.0)
Hemoglobin: 14.4 g/dL (ref 12.0–15.0)
MCH: 28.3 pg (ref 26.0–34.0)
MCHC: 34.3 g/dL (ref 30.0–36.0)
MCV: 82.5 fL (ref 80.0–100.0)
Platelets: 397 K/uL (ref 150–400)
RBC: 5.09 MIL/uL (ref 3.87–5.11)
RDW: 13.5 % (ref 11.5–15.5)
WBC: 10.3 K/uL (ref 4.0–10.5)
nRBC: 0 % (ref 0.0–0.2)

## 2024-06-08 LAB — BASIC METABOLIC PANEL WITH GFR
Anion gap: 14 (ref 5–15)
BUN: 8 mg/dL (ref 6–20)
CO2: 24 mmol/L (ref 22–32)
Calcium: 9.7 mg/dL (ref 8.9–10.3)
Chloride: 100 mmol/L (ref 98–111)
Creatinine, Ser: 0.8 mg/dL (ref 0.44–1.00)
GFR, Estimated: >60 mL/min (ref >60)
Glucose, Bld: 111 mg/dL — ABNORMAL HIGH (ref 70–99)
Potassium: 3.2 mmol/L — ABNORMAL LOW (ref 3.5–5.1)
Sodium: 138 mmol/L (ref 135–145)

## 2024-06-08 LAB — PREGNANCY, URINE: Preg Test, Ur: NEGATIVE

## 2024-06-08 LAB — TROPONIN T, HIGH SENSITIVITY
Troponin T High Sensitivity: <15 ng/L (ref 0–19)
Troponin T High Sensitivity: <15 ng/L (ref 0–19)

## 2024-06-08 LAB — D-DIMER, QUANTITATIVE: D-Dimer, Quant: 0.27 ug{FEU}/mL (ref 0.00–0.50)

## 2024-06-08 MED ORDER — POTASSIUM CHLORIDE CRYS ER 20 MEQ PO TBCR: 20.0000 meq | EXTENDED_RELEASE_TABLET | Freq: Two times a day (BID) | ORAL | 0 refills | Status: AC

## 2024-06-08 MED ORDER — SODIUM CHLORIDE 0.9 % IV BOLUS: 1000.0000 mL | Freq: Once | INTRAVENOUS | Status: DC

## 2024-06-08 NOTE — ED Provider Notes (Signed)
 [facility] Provider Note   CSN: [id number] Arrival date & time: 06/08/24  8196     Patient presents with: Chest Pain   Jill Hughes is a 48 y.o. female.   Patient to ED for evaluation of discomfort in the left chest, through to back and involving the left arm. She denies SOB/DOE. Nothing makes it better or worse. She has noticed her heart rate has been elevated over the last 2-3 days as well. No abdominal pain, nausea. No cough or fever. She reports earlier in the week she felt she was having the beginning of symptoms of an illness but took Zicam several days ago which resolved the symptoms.   The history is provided by the patient. No language interpreter was used.  Chest Pain      Prior to Admission medications   Medication Sig Start Date End Date Taking? Authorizing Provider  potassium chloride  SA (KLOR-CON  M) 20 MEQ tablet Take 1 tablet (20 mEq total) by mouth 2 (two) times daily. 06/08/24  Yes Odell Balls, PA-C  amoxicillin -clavulanate (AUGMENTIN ) 875-125 MG tablet Take 1 tablet by mouth every 12 (twelve) hours. 12/15/21   Hazen Darryle BRAVO, FNP    Allergies: Nickel    Review of Systems  Cardiovascular:  Positive for chest pain.    Updated Vital Signs BP (!) 142/78   Pulse 88   Temp 98.5 F (36.9 C) (Oral)   Resp 18   Ht 5' 5.5 (1.664 m)   Wt 92 kg   LMP 05/09/2024 (Approximate)   SpO2 99%   BMI 33.24 kg/m   Physical Exam Vitals and nursing note reviewed.  Constitutional:      Appearance: She is well-developed.  HENT:     Head: Normocephalic.  Cardiovascular:     Rate and Rhythm: Normal rate and regular rhythm.     Heart sounds: No murmur heard. Pulmonary:     Effort: Pulmonary effort is normal.     Breath sounds: Normal breath sounds. No wheezing, rhonchi or rales.  Chest:     Chest wall: No tenderness.  Abdominal:     General: Bowel sounds are normal.     Palpations: Abdomen is soft.      Tenderness: There is no abdominal tenderness. There is no guarding or rebound.  Musculoskeletal:        General: Normal range of motion.     Cervical back: Normal range of motion and neck supple.  Skin:    General: Skin is warm and dry.  Neurological:     General: No focal deficit present.     Mental Status: She is alert and oriented to person, place, and time.     (all labs ordered are listed, but only abnormal results are displayed) Labs Reviewed  BASIC METABOLIC PANEL WITH GFR - Abnormal; Notable for the following components:      Result Value   Potassium 3.2 (*)    Glucose, Bld 111 (*)    All other components within normal limits  CBC  PREGNANCY, URINE  D-DIMER, QUANTITATIVE  TROPONIN T, HIGH SENSITIVITY  TROPONIN T, HIGH SENSITIVITY    EKG: EKG Interpretation Date/Time:  Sunday June 08 2024 18:15:17 EST Ventricular Rate:  116 PR Interval:  141 QRS Duration:  83 QT Interval:  321 QTC Calculation: 446 R Axis:   69  Text Interpretation: Sinus tachycardia RSR' in V1 or V2, right VCD or RVH Borderline T abnormalities, inferior leads Baseline wander in lead(s)  II III aVF No significant change since prior 11/23 Confirmed by Towana Sharper [phone] on 06/08/2024 6:16:48 PM  Radiology: DG Chest 2 View Result Date: 06/08/2024 CLINICAL DATA:  Chest pain EXAM: CHEST - 2 VIEW COMPARISON:  Chest x-ray 05/13/2022 FINDINGS: The heart size and mediastinal contours are within normal limits. Both lungs are clear. The visualized skeletal structures are unremarkable. IMPRESSION: No active cardiopulmonary disease. Electronically Signed   By: Greig Pique M.D.   On: 06/08/2024 18:55     Procedures   Medications Ordered in the ED - No data to display  Clinical Course as of 06/08/24 2116  Sun Jun 08, 2024  8145 Patient with symptoms of left chest pain, to the back and left arm. She is tachycardic on arrival. She is taking oral birth control and is felt a risk for PE. Labs pending.  VSS, no hypoxia.  [SU]  2114 Labs reassuring: d dimer negative, No leukocytosis, normal hgb, normal renal function. She is not pregnant. Troponin x 2 negative. CXR clear - no PTX, PNA, CM. EKG per Dr. Towana: EKG Interpretation Date/Time:  "Sunday June 08 2024 18:15:17 EST Ventricular Rate:  116 PR Interval:  141 QRS Duration:  83 QT Interval:  321 QTC Calculation: 446 R Axis:   69  Text Interpretation: Sinus tachycardia RSR' in V1 or V2, right VCD or RVH Borderline T abnormalities, inferior leads Baseline wander in lead(s) II III aVF No significant change since prior 11/23 Confirmed by Butler, Michael ([id number]) on 06/08/2024 6:16:48 PM  Findings discussed with the patient. She is planning on follow up with her cardiologist and PCP. Discussed return precautions.  [SU]    Clinical Course User Index [SU] Zanna Hawn, PA-C                                 Medical Decision Making Amount and/or Complexity of Data Reviewed Labs: ordered. Radiology: ordered.  Risk Prescription drug management.        Final diagnoses:  Nonspecific chest pain  Hypokalemia    ED Discharge Orders          Ordered    potassium chloride SA (KLOR-CON M) 20 MEQ tablet  2 times daily        11" /30/25 2114               Odell Balls, PA-C 06/08/24 2116    Towana Sharper BROCKS, MD 06/09/24 [phone]

## 2024-06-08 NOTE — ED Triage Notes (Signed)
 L sided CP for several days. Pt denies shob. Pt states I've had a lot going on the past few days.

## 2024-06-08 NOTE — Discharge Instructions (Addendum)
 As we discussed, your tests tonight in evaluation of symptoms including left sided chest discomfort associated with left neck, back and left arm involvement are negative for emergent serious or life-threatening processes. No infection, blood clots, lung abnormalities, heart conditions are identified. Please follow up with your cardiologist as planned for further outpatient evaluation as indicated.   If symptoms worsen - high fever, severe pain, significant shortness of breath - or for new concern, please return to the ED at any time.

## 2024-06-24 ENCOUNTER — Other Ambulatory Visit

## 2024-07-15 ENCOUNTER — Other Ambulatory Visit: Payer: Self-pay | Admitting: Medical Genetics

## 2024-07-15 DIAGNOSIS — Z006 Encounter for examination for normal comparison and control in clinical research program: Secondary | ICD-10-CM

## 2024-07-27 LAB — GENECONNECT MOLECULAR SCREEN: Genetic Analysis Overall Interpretation: NEGATIVE

## 2024-08-29 ENCOUNTER — Other Ambulatory Visit (HOSPITAL_COMMUNITY)
